# Patient Record
Sex: Male | Born: 1995 | Race: Black or African American | Hispanic: No | Marital: Single | Smoking: Never smoker
Health system: Southern US, Community
[De-identification: ages and names within clinical notes are randomized; demographics above are authoritative.]

---
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
## 2014-11-14 ENCOUNTER — Encounter: Payer: Self-pay | Admitting: Emergency Medicine

## 2014-11-14 ENCOUNTER — Emergency Department
Admission: EM | Admit: 2014-11-14 | Discharge: 2014-11-14 | Disposition: A | Discharge status: Home / Self Care | Payer: BLUE CROSS/BLUE SHIELD | Attending: Emergency Medicine | Admitting: Emergency Medicine

## 2014-11-14 ENCOUNTER — Emergency Department: Payer: BLUE CROSS/BLUE SHIELD

## 2014-11-14 DIAGNOSIS — S86911A Strain of unspecified muscle(s) and tendon(s) at lower leg level, right leg, initial encounter: Secondary | ICD-10-CM | POA: Diagnosis not present

## 2014-11-14 DIAGNOSIS — S8991XA Unspecified injury of right lower leg, initial encounter: Secondary | ICD-10-CM | POA: Diagnosis present

## 2014-11-14 DIAGNOSIS — Y9289 Other specified places as the place of occurrence of the external cause: Secondary | ICD-10-CM | POA: Insufficient documentation

## 2014-11-14 DIAGNOSIS — X58XXXA Exposure to other specified factors, initial encounter: Secondary | ICD-10-CM | POA: Insufficient documentation

## 2014-11-14 DIAGNOSIS — Y9367 Activity, basketball: Secondary | ICD-10-CM | POA: Diagnosis not present

## 2014-11-14 DIAGNOSIS — Y998 Other external cause status: Secondary | ICD-10-CM | POA: Diagnosis not present

## 2014-11-14 MED ORDER — IBUPROFEN 800 MG PO TABS: 800.0000 mg | ORAL_TABLET | Freq: Three times a day (TID) | ORAL | Status: AC | PRN

## 2014-11-14 MED ORDER — CYCLOBENZAPRINE HCL 5 MG PO TABS: 5.0000 mg | ORAL_TABLET | Freq: Three times a day (TID) | ORAL | Status: AC | PRN

## 2014-11-14 NOTE — ED Notes (Signed)
Pain right knee past 6 wks, thinks he may have injured it playing basketball

## 2014-11-14 NOTE — ED Provider Notes (Signed)
[facility] Emergency Department Provider Note  ____________________________________________  Time seen: Approximately 10:07 AM  I have reviewed the triage vital signs and the nursing notes.   HISTORY  Chief Complaint Knee Pain    HPI Trevor Daniel is a 19 y.o. male presents with complaints of right knee pain 6 months. Has been playing a lot of basketball. Increased pain with jumping and landing. Denies severe pain with ambulating, does feel pulling sensation. No noticeable change in strength.   History reviewed. No pertinent past medical history.  Patient Active Problem List   Diagnosis Date Noted  . 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 No pertinent past surgical history.  Current Outpatient Rx  Name  Route  Sig  Dispense  Refill  . cyclobenzaprine (FLEXERIL) 5 MG tablet   Oral   Take 1 tablet (5 mg total) by mouth every 8 (eight) hours as needed for muscle spasms.   30 tablet   0   . ibuprofen (ADVIL,MOTRIN) 800 MG tablet   Oral   Take 1 tablet (800 mg total) by mouth every 8 (eight) hours as needed.   30 tablet   0     Allergies Review of patient's allergies indicates no known allergies.  History reviewed. No pertinent family history.  Social History History  Substance Use Topics  . Smoking status: Never Smoker   . Smokeless tobacco: Not on file  . Alcohol Use: No    Review of Systems Constitutional: No fever/chills Musculoskeletal: Negative for back pain. Skin: Negative for rash. Neurological: Negative for headaches, focal weakness or numbness.  10-point ROS otherwise negative.  ____________________________________________   PHYSICAL EXAM:  VITAL SIGNS: ED Triage Vitals  Enc Vitals Group     BP 11/14/14 0815 168/96 mmHg     Pulse Rate 11/14/14 0815 78     Resp 11/14/14 0815 20     Temp 11/14/14 0815 98 F (36.7 C)     Temp Source 11/14/14 0815 Oral     SpO2  11/14/14 0815 100 %     Weight 11/14/14 0815 240 lb (108.863 kg)     Height 11/14/14 0815 6\' 5"  (1.956 m)     Head Cir --      Peak Flow --      Pain Score 11/14/14 0815 7     Pain Loc --      Pain Edu? --      Excl. in GC? --     Constitutional: Alert and oriented. Well appearing and in no acute distress. Musculoskeletal: No lower extremity tenderness nor edema.  No joint effusions. Neurologic:  Normal speech and language. No gross focal neurologic deficits are appreciated. Speech is normal. No gait instability. Skin:  Skin is warm, dry and intact. No rash noted. Psychiatric: Mood and affect are normal. Speech and behavior are normal.  ____________________________________________   LABS (all labs ordered are listed, but only abnormal results are displayed)  Labs Reviewed - No data to display ____________________________________________  EKG  None ____________________________________________  RADIOLOGY  Interpreted by radiologist reviewed by myself negative for fracture or dislocation. ____________________________________________   PROCEDURES  Procedure(s) performed: None  Critical Care performed: No  ____________________________________________   INITIAL IMPRESSION / ASSESSMENT AND PLAN / ED COURSE  Pertinent labs & imaging results that were available during my care of the patient were reviewed by me and considered in my medical decision making (see chart for details).  Acute right knee strain. Discussed clinical findings  as well as radiological findings with patient. Patient will follow up with orthopedics as needed for continued examination. Rx Motrin 800 and Flexeril 10 mg given patient understands and will return to the ER if worsening symptomatology. ____________________________________________   FINAL CLINICAL IMPRESSION(S) / ED DIAGNOSES  Final diagnoses:  Knee strain, right, initial encounter      Evangeline Dakin, PA-C 11/14/14 1059  Phineas Semen, MD 11/14/14 1230

## 2014-11-14 NOTE — ED Notes (Signed)
Right knee pain x 1 month.  Ambulates without difficulty

## 2014-11-14 NOTE — ED Notes (Signed)
Pt alert and oriented X4, active, cooperative, pt in NAD. RR even and unlabored, color WNL.  Pt informed to return if any life threatening symptoms occur.  Left with family.  

## 2014-11-14 NOTE — Discharge Instructions (Signed)
Joint Sprain A sprain is a tear or stretch in the ligaments that hold a joint together. Severe sprains may need as long as 3-6 weeks of immobilization and/or exercises to heal completely. Sprained joints should be rested and protected. If not, they can become unstable and prone to re-injury. Proper treatment can reduce your pain, shorten the period of disability, and reduce the risk of repeated injuries. TREATMENT   Rest and elevate the injured joint to reduce pain and swelling.  Apply ice packs to the injury for 20-30 minutes every 2-3 hours for the next 2-3 days.  Keep the injury wrapped in a compression bandage or splint as long as the joint is painful or as instructed by your caregiver.  Do not use the injured joint until it is completely healed to prevent re-injury and chronic instability. Follow the instructions of your caregiver.  Long-term sprain management may require exercises and/or treatment by a physical therapist. Taping or special braces may help stabilize the joint until it is completely better. SEEK MEDICAL CARE IF:   You develop increased pain or swelling of the joint.  You develop increasing redness and warmth of the joint.  You develop a fever.  It becomes stiff.  Your hand or foot gets cold or numb. Document Released: 07/17/2004 Document Revised: 09/01/2011 Document Reviewed: 06/26/2008 [facility] Patient Information 2015 [place], [place]. This information is not intended to replace advice given to you by your health care provider. Make sure you discuss any questions you have with your health care provider.  Knee Sprain A knee sprain is a tear in one of the strong, fibrous tissues that connect the bones (ligaments) in your knee. The severity of the sprain depends on how much of the ligament is torn. The tear can be either partial or complete. CAUSES  Often, sprains are a result of a fall or injury. The force of the impact causes the fibers of your ligament to stretch  too much. This excess tension causes the fibers of your ligament to tear. SIGNS AND SYMPTOMS  You may have some loss of motion in your knee. Other symptoms include:  Bruising.  Pain in the knee area.  Tenderness of the knee to the touch.  Swelling. DIAGNOSIS  To diagnose a knee sprain, your health care provider will physically examine your knee. Your health care provider may also suggest an X-ray exam of your knee to make sure no bones are broken. TREATMENT  If your ligament is only partially torn, treatment usually involves keeping the knee in a fixed position (immobilization) or bracing your knee for activities that require movement for several weeks. To do this, your health care provider will apply a bandage, cast, or splint to keep your knee from moving and to support your knee during movement until it heals. For a partially torn ligament, the healing process usually takes 4-6 weeks. If your ligament is completely torn, depending on which ligament it is, you may need surgery to reconnect the ligament to the bone or reconstruct it. After surgery, a cast or splint may be applied and will need to stay on your knee for 4-6 weeks while your ligament heals. HOME CARE INSTRUCTIONS  Keep your injured knee elevated to decrease swelling.  To ease pain and swelling, apply ice to the injured area:  Put ice in a plastic bag.  Place a towel between your skin and the bag.  Leave the ice on for 20 minutes, 2-3 times a day.  Only take medicine for  pain as directed by your health care provider.  Do not leave your knee unprotected until pain and stiffness go away (usually 4-6 weeks).  If you have a cast or splint, do not allow it to get wet. If you have been instructed not to remove it, cover it with a plastic bag when you shower or bathe. Do not swim.  Your health care provider may suggest exercises for you to do during your recovery to prevent or limit permanent weakness and stiffness. SEEK  IMMEDIATE MEDICAL CARE IF:  Your cast or splint becomes damaged.  Your pain becomes worse.  You have significant pain, swelling, or numbness below the cast or splint. MAKE SURE YOU:  Understand these instructions.  Will watch your condition.  Will get help right away if you are not doing well or get worse. Document Released: 06/09/2005 Document Revised: 03/30/2013 Document Reviewed: 01/19/2013 [facility] Patient Information 2015 [place], [place]. This information is not intended to replace advice given to you by your health care provider. Make sure you discuss any questions you have with your health care provider.

## 2015-12-22 IMAGING — CR DG KNEE COMPLETE 4+V*L*
1 series · 4 of 4 positions shown · non-contrast
Comparison: None.

CLINICAL DATA: Knee pain following injury 2 days ago.

EXAM:
LEFT KNEE - COMPLETE 4+ VIEW

[Series 1: oblique · 0.17mm/px · 4 of 4 slices shown]
[im 1/4]
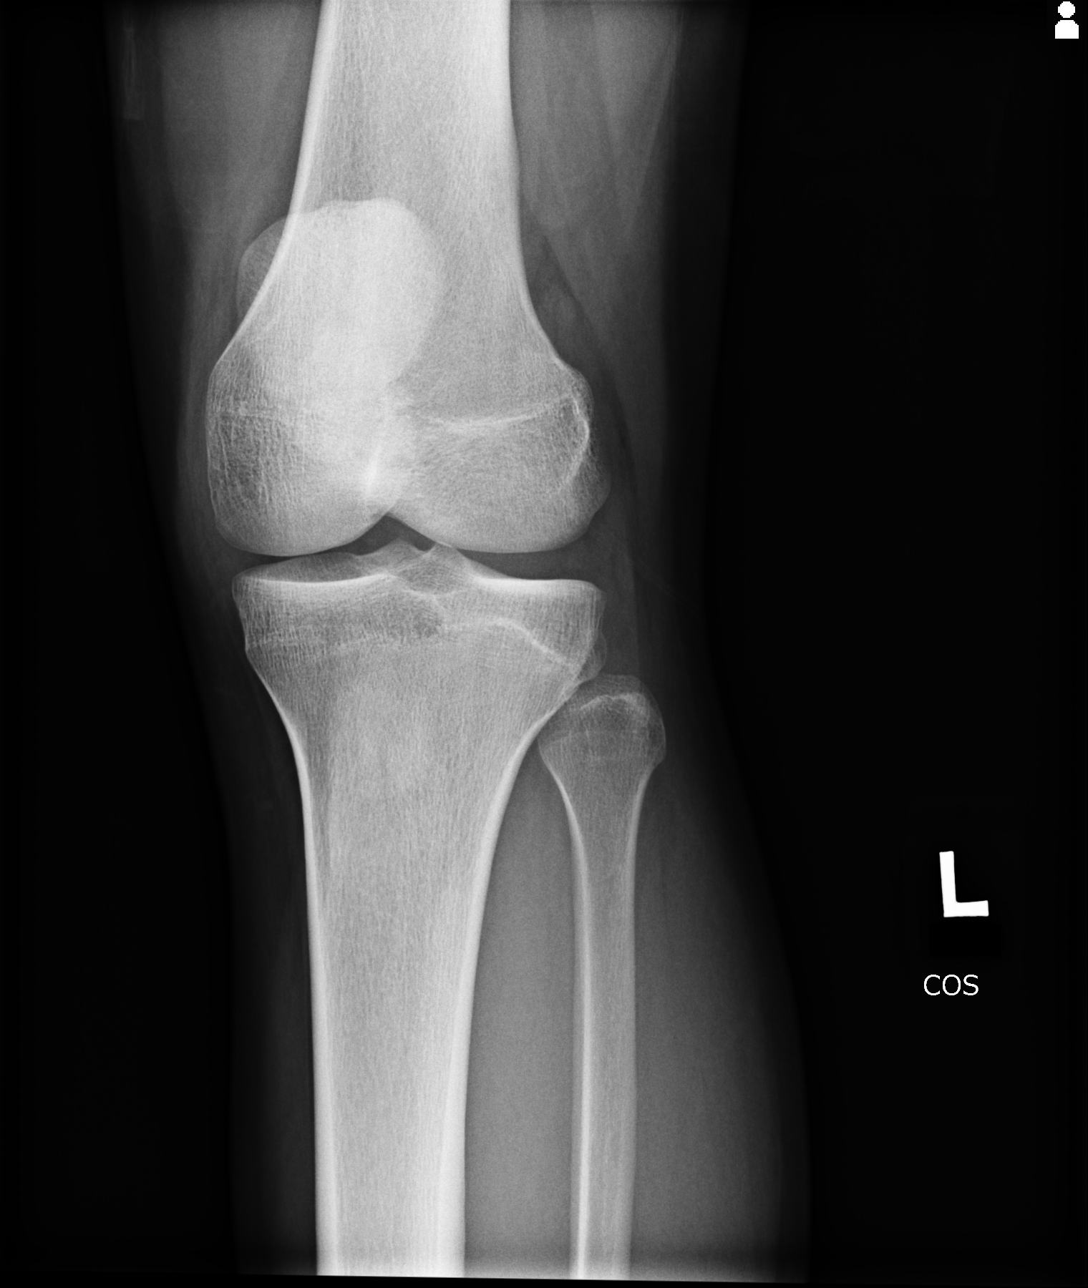
[im 2/4]
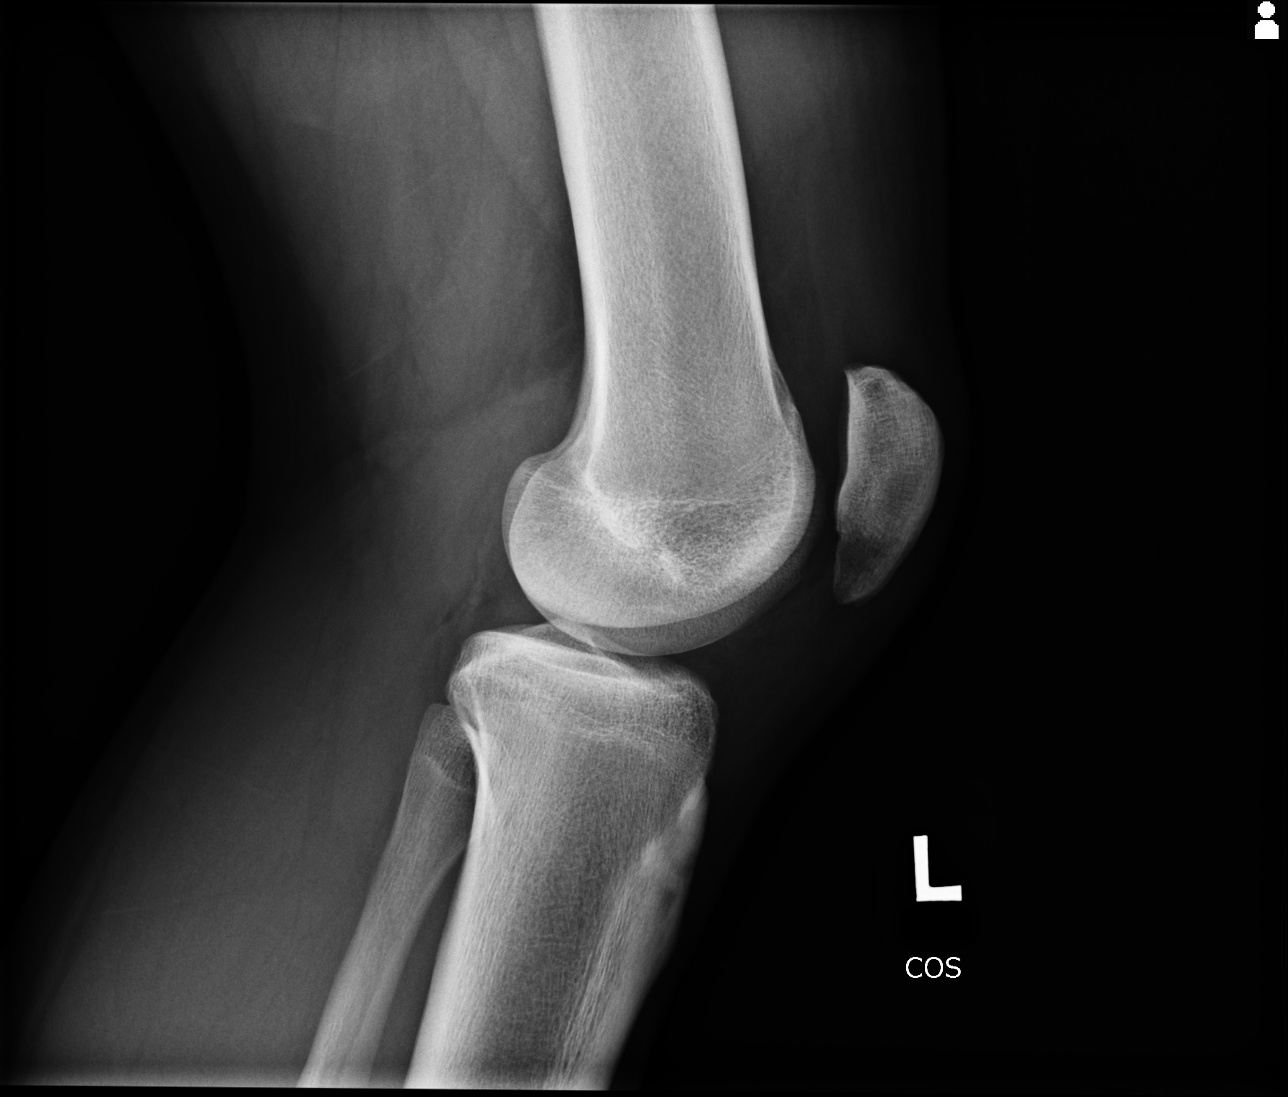
[im 3/4]
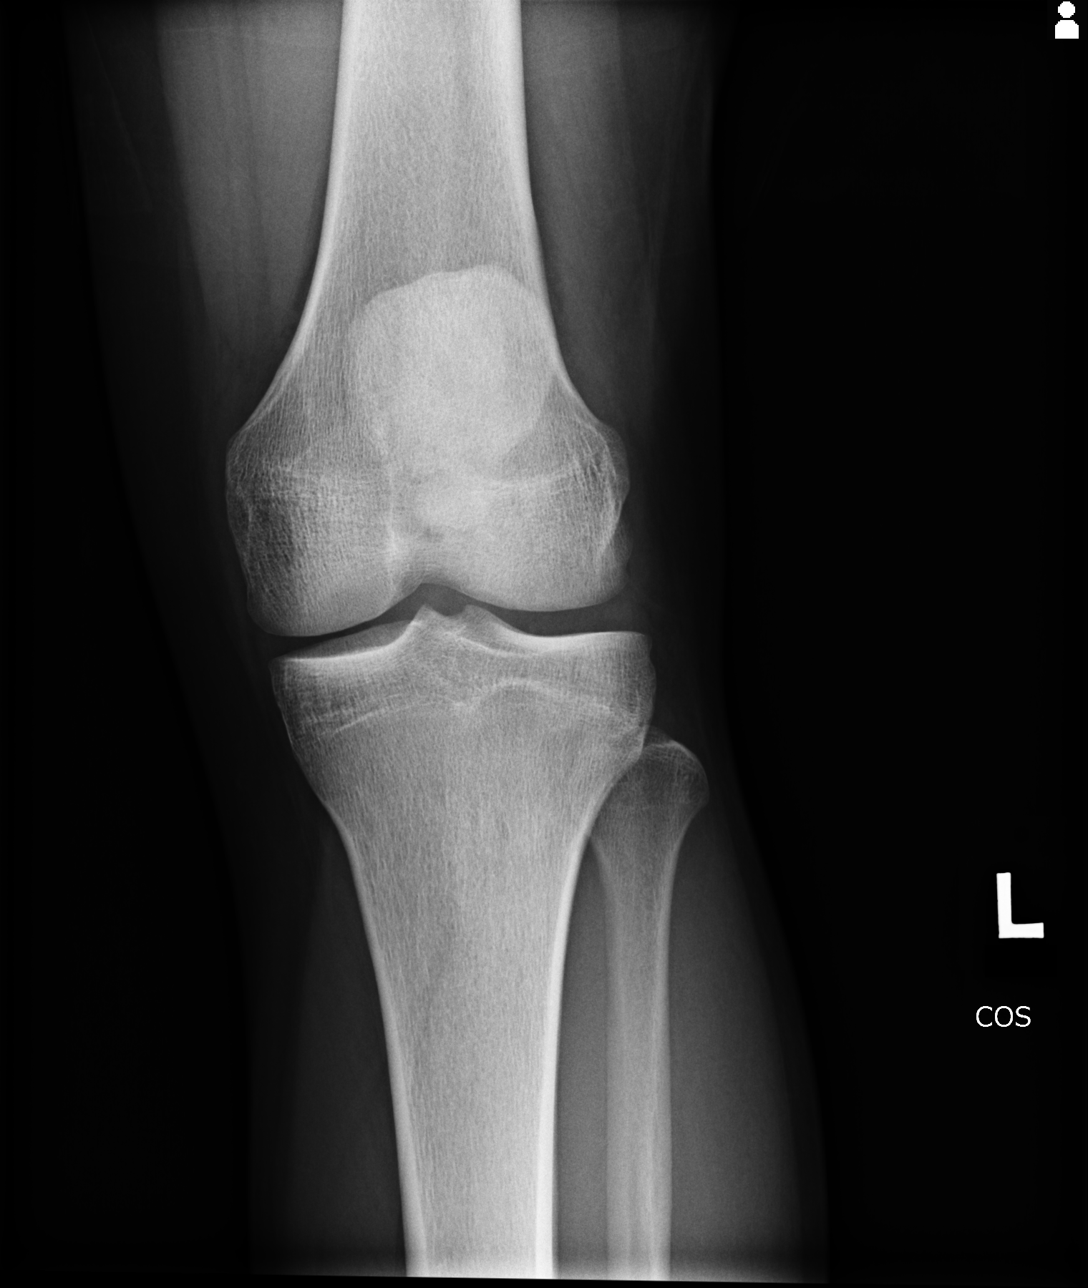
[im 4/4]
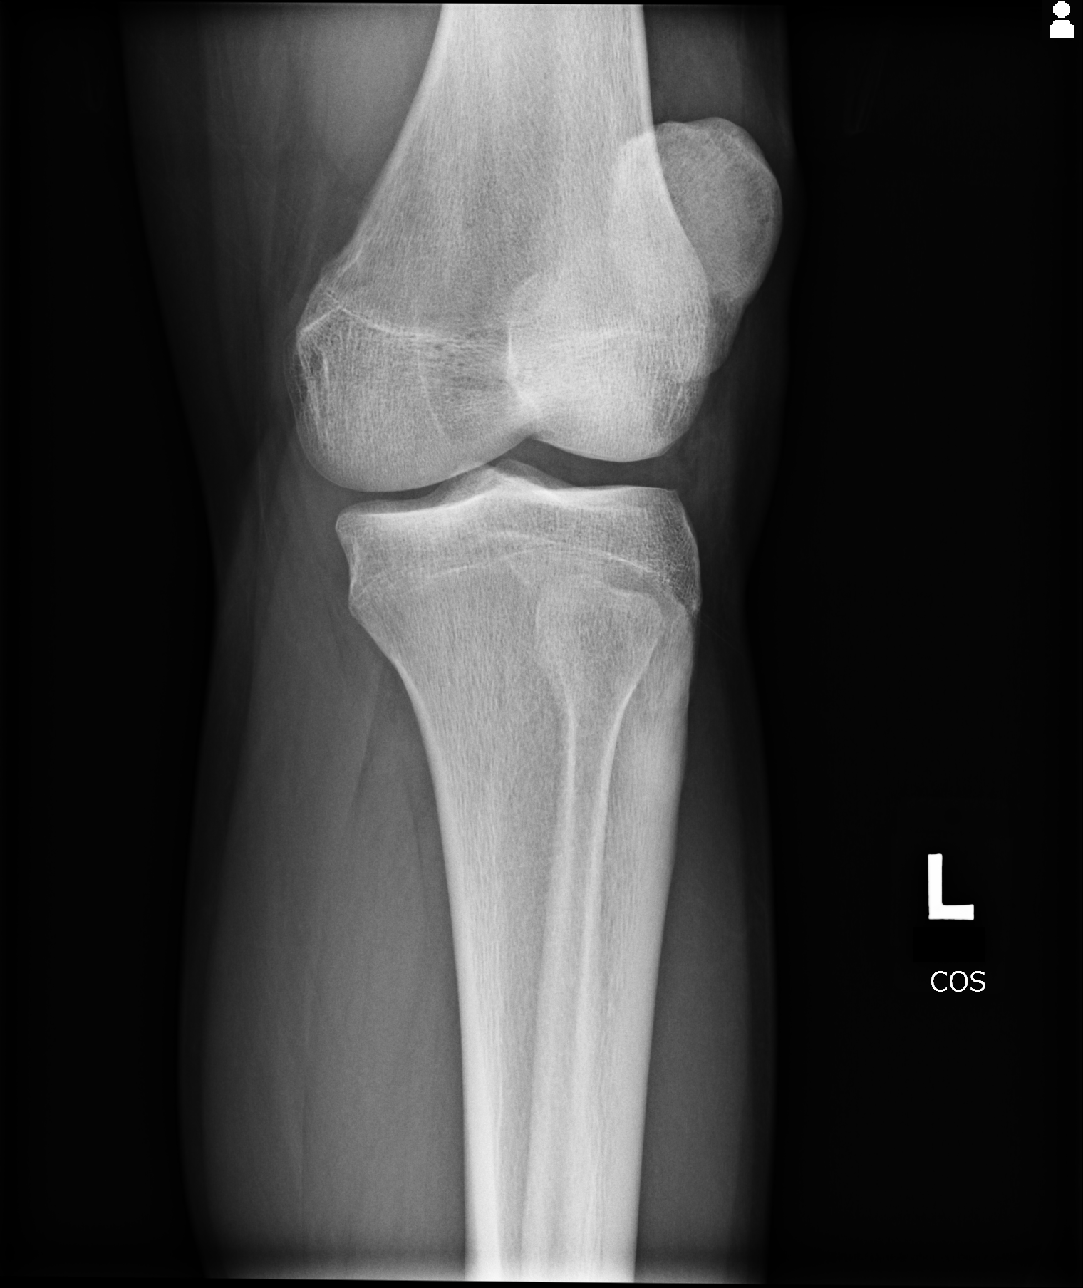

[4 of 4 positions shown; findings below may reference images not displayed]

FINDINGS: The mineralization and alignment are normal. There is no evidence of
acute fracture or dislocation. The joint spaces are maintained. No
joint effusion or focal soft tissue swelling identified.
IMPRESSION: No acute osseous findings.

## 2016-02-02 ENCOUNTER — Emergency Department
Admission: EM | Admit: 2016-02-02 | Discharge: 2016-02-02 | Disposition: A | Discharge status: Home / Self Care | Payer: Self-pay | Attending: Emergency Medicine | Admitting: Emergency Medicine

## 2016-02-02 ENCOUNTER — Emergency Department: Payer: Self-pay

## 2016-02-02 ENCOUNTER — Encounter: Payer: Self-pay | Admitting: Emergency Medicine

## 2016-02-02 DIAGNOSIS — R079 Chest pain, unspecified: Secondary | ICD-10-CM | POA: Insufficient documentation

## 2016-02-02 LAB — BASIC METABOLIC PANEL
ANION GAP: 10 (ref 5–15)
BUN: 15 mg/dL (ref 6–20)
CALCIUM: 9.9 mg/dL (ref 8.9–10.3)
CO2: 25 mmol/L (ref 22–32)
Chloride: 106 mmol/L (ref 101–111)
Creatinine, Ser: 1.32 mg/dL — ABNORMAL HIGH (ref 0.61–1.24)
GFR calc Af Amer: >60 mL/min (ref >60)
GFR calc non Af Amer: >60 mL/min (ref >60)
Glucose, Bld: 92 mg/dL (ref 65–99)
Potassium: 3.7 mmol/L (ref 3.5–5.1)
Sodium: 141 mmol/L (ref 135–145)

## 2016-02-02 LAB — CBC
HCT: 47.4 % (ref 40.0–52.0)
HEMOGLOBIN: 16.7 g/dL (ref 13.0–18.0)
MCH: 29.2 pg (ref 26.0–34.0)
MCHC: 35.2 g/dL (ref 32.0–36.0)
MCV: 83 fL (ref 80.0–100.0)
Platelets: 249 10*3/uL (ref 150–440)
RBC: 5.71 MIL/uL (ref 4.40–5.90)
RDW: 13.5 % (ref 11.5–14.5)
WBC: 10 10*3/uL (ref 3.8–10.6)

## 2016-02-02 LAB — TROPONIN I

## 2016-02-02 MED ORDER — TRAMADOL HCL 50 MG PO TABS: 50.0000 mg | ORAL_TABLET | Freq: Four times a day (QID) | ORAL | 0 refills | Status: AC | PRN

## 2016-02-02 NOTE — ED Triage Notes (Addendum)
Pt reports Chest pain past couple of days, SOB states He feels like that side of his chest looks larger. Denies cough. C/o palpitations and high blood pressure

## 2016-02-02 NOTE — Discharge Instructions (Signed)
You have been seen in the emergency department today for chest pain. Your workup has shown normal results. As we discussed please follow-up with your primary care physician in the next 1-2 days for recheck. Return to the emergency department for any further chest pain, trouble breathing, or any other symptom personally concerning to yourself. °

## 2016-02-02 NOTE — ED Notes (Signed)
Patient c/o left sided chest pain that started 2 weeks ago, pain comes and goes, does not occur every day. Patient also reports some shortness of breath that comes and goes. Patient states that he was online looking at his symptoms and saw that it could possibly indicated an enlarged heart and he was concerned and would like to be evaluated.

## 2016-02-02 NOTE — ED Provider Notes (Signed)
[facility] Provider [facility]  Time seen: 1:16 PM  I have reviewed the triage vital signs and the nursing notes.   HISTORY  Chief Complaint Chest Pain    HPI Trevor Daniel is a 20 y.o. male with no past medical history who presents the emergency department for intermittent chest pain over the past 2 weeks. According to the patient for the past 2 weeks he has been intermittently having left-sided chest pain which she describes as mild and dull. He thinks it could be related to high blood pressure. Patient has never been formally diagnosed with high blood pressure. Patient denies any chest pain with exertion. States he plays basketball without any chest pain. Denies any cough, congestion, lower extremity swelling or pain. Denies any shortness of breath.  History reviewed. No pertinent past medical history.  Patient Active Problem List   Diagnosis Date Noted  . 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 No pertinent surgical history.  Prior to Admission medications   Medication Sig Start Date End Date Taking? Authorizing Provider  cyclobenzaprine (FLEXERIL) 5 MG tablet Take 1 tablet (5 mg total) by mouth every 8 (eight) hours as needed for muscle spasms. Patient not taking: Reported on 02/02/2016 11/14/14   Charmayne Sheerharles M Beers, PA-C  ibuprofen (ADVIL,MOTRIN) 800 MG tablet Take 1 tablet (800 mg total) by mouth every 8 (eight) hours as needed. Patient not taking: Reported on 02/02/2016 11/14/14   Evangeline Dakinharles M Beers, PA-C    No Known Allergies  No family history on file.  Social History Social History  Substance Use Topics  . Smoking status: Never Smoker  . Smokeless tobacco: Never Used  . Alcohol use No    Review of Systems Constitutional: Negative for fever. Cardiovascular: Intermittent left chest pain. Respiratory: Negative for shortness of breath. Gastrointestinal: Negative for abdominal  pain Musculoskeletal: Negative for back pain. Neurological: Negative for headaches, focal weakness or numbness. 10-point ROS otherwise negative.  ____________________________________________   PHYSICAL EXAM:  VITAL SIGNS: ED Triage Vitals  Enc Vitals Group     BP 02/02/16 1131 (!) 172/112     Pulse Rate 02/02/16 1131 (!) 109     Resp 02/02/16 1131 20     Temp 02/02/16 1131 98.5 F (36.9 C)     Temp Source 02/02/16 1131 Oral     SpO2 02/02/16 1131 97 %     Weight 02/02/16 1132 250 lb (113.4 kg)     Height 02/02/16 1132 6\' 5"  (1.956 m)     Head Circumference --      Peak Flow --      Pain Score 02/02/16 1132 5     Pain Loc --      Pain Edu? --      Excl. in GC? --     Constitutional: Alert and oriented. Well appearing and in no distress. Eyes: Normal exam ENT   Head: Normocephalic and atraumatic.   Mouth/Throat: Mucous membranes are moist. Cardiovascular: Normal rate, regular rhythm. No murmur Respiratory: Normal respiratory effort without tachypnea nor retractions. Breath sounds are clear. No chest tenderness to palpation. Gastrointestinal: Soft and nontender. No distention.   Musculoskeletal: Nontender with normal range of motion in all extremities. No lower extremity tenderness or edema. Neurologic:  Normal speech and language. No gross focal neurologic deficits are appreciated. Skin:  Skin is warm, dry and intact.  Psychiatric: Mood and affect are normal. Speech and behavior are normal.   ____________________________________________    EKG  EKG reviewed and interpreted, so shows sinus tachycardia 101 bpm, narrow QRS, normal axis, normal intervals, no concerning ST changes. Mild tachycardia, otherwise normal EKG.  ____________________________________________    RADIOLOGY  Chest x-ray negative  ____________________________________________   INITIAL IMPRESSION / ASSESSMENT AND PLAN / ED COURSE  Pertinent labs & imaging results that were available  during my care of the patient were reviewed by me and considered in my medical decision making (see chart for details).  The patient presents the emergency department intermittent chest pain over the past 2 weeks. Denies any exertional component. Patient denies any chest pain currently. Labs are within normal limits including negative troponin. EKG shows mild tachycardia at 101 bpm otherwise normal. Overall patient appears very well with a normal physical exam. Denies any chest pain at this time. We will discharge home with a short course of Ultram to be taken as needed for chest discomfort. I discussed with the patient if the pain does not resolve within one week following up with cardiology. Patient is agreeable.   ____________________________________________   FINAL CLINICAL IMPRESSION(S) / ED DIAGNOSES  Chest pain    Minna Antis, MD 02/02/16 1319

## 2016-02-04 ENCOUNTER — Telehealth: Payer: Self-pay

## 2016-02-04 NOTE — Telephone Encounter (Signed)
Called multiple times, no answer and vm was full. Needed to make ED fu appointment.  Will try again later

## 2016-02-05 NOTE — Telephone Encounter (Signed)
Called patient, no answer and vm was full. Needed to make ED fu appointment.  Will try again later

## 2016-08-11 ENCOUNTER — Encounter: Payer: Self-pay | Admitting: Emergency Medicine

## 2016-08-11 ENCOUNTER — Emergency Department: Payer: Self-pay

## 2016-08-11 ENCOUNTER — Emergency Department
Admission: EM | Admit: 2016-08-11 | Discharge: 2016-08-11 | Disposition: A | Discharge status: Home / Self Care | Payer: Self-pay | Attending: Emergency Medicine | Admitting: Emergency Medicine

## 2016-08-11 DIAGNOSIS — R0789 Other chest pain: Secondary | ICD-10-CM

## 2016-08-11 LAB — COMPREHENSIVE METABOLIC PANEL
ALT: 19 U/L (ref 17–63)
AST: 20 U/L (ref 15–41)
Albumin: 4.5 g/dL (ref 3.5–5.0)
Alkaline Phosphatase: 85 U/L (ref 38–126)
Anion gap: 6 (ref 5–15)
BUN: 9 mg/dL (ref 6–20)
CO2: 31 mmol/L (ref 22–32)
Calcium: 9.4 mg/dL (ref 8.9–10.3)
Chloride: 102 mmol/L (ref 101–111)
Creatinine, Ser: 1.2 mg/dL (ref 0.61–1.24)
GFR calc Af Amer: >60 mL/min (ref >60)
GFR calc non Af Amer: >60 mL/min (ref >60)
Glucose, Bld: 97 mg/dL (ref 65–99)
Potassium: 3.6 mmol/L (ref 3.5–5.1)
Sodium: 139 mmol/L (ref 135–145)
Total Bilirubin: 0.5 mg/dL (ref 0.3–1.2)
Total Protein: 7.7 g/dL (ref 6.5–8.1)

## 2016-08-11 LAB — CBC
HCT: 46.2 % (ref 40.0–52.0)
Hemoglobin: 15.6 g/dL (ref 13.0–18.0)
MCH: 28 pg (ref 26.0–34.0)
MCHC: 33.8 g/dL (ref 32.0–36.0)
MCV: 83 fL (ref 80.0–100.0)
Platelets: 215 10*3/uL (ref 150–440)
RBC: 5.57 MIL/uL (ref 4.40–5.90)
RDW: 13.2 % (ref 11.5–14.5)
WBC: 5.8 10*3/uL (ref 3.8–10.6)

## 2016-08-11 LAB — TROPONIN I: Troponin I: <0.03 ng/mL (ref <0.03)

## 2016-08-11 NOTE — ED Provider Notes (Signed)
[facility] Emergency Department Provider Note   ____________________________________________    I have reviewed the triage vital signs and the nursing notes.   HISTORY  Chief Complaint Chest Pain     HPI Trevor Daniel is a 21 y.o. male who presents with complaints of intermittent chest pain. Patient reports this has been occurring over the last 4-5 months and has actually improved over the last month. He reports it used to occur as frequently as 5 or 6 times a week and now it only occurs once or twice a week. He reports there is no pattern to the discomfort but reports he feels a brief sharp pain in the left side of his chest. It is nonexertional and seemingly random. Not related to eating. No shortness of breath. No recent travel. No calf pain or swelling. No fevers or chills or cough. Does not smoke or drink   History reviewed. No pertinent past medical history.  Patient Active Problem List   Diagnosis Date Noted  . 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 No pertinent surgical history.  Prior to Admission medications   Medication Sig Start Date End Date Taking? Authorizing Provider  cyclobenzaprine (FLEXERIL) 5 MG tablet Take 1 tablet (5 mg total) by mouth every 8 (eight) hours as needed for muscle spasms. Patient not taking: Reported on 02/02/2016 11/14/14   Charmayne Sheer Beers, PA-C  ibuprofen (ADVIL,MOTRIN) 800 MG tablet Take 1 tablet (800 mg total) by mouth every 8 (eight) hours as needed. Patient not taking: Reported on 02/02/2016 11/14/14   Charmayne Sheer Beers, PA-C  traMADol (ULTRAM) 50 MG tablet Take 1 tablet (50 mg total) by mouth every 6 (six) hours as needed. Patient not taking: Reported on 08/11/2016 02/02/16   Minna Antis, MD     Allergies Patient has no known allergies.  History reviewed. No pertinent family history.  Social History Social History  Substance Use Topics  . Smoking status:  Never Smoker  . Smokeless tobacco: Never Used  . Alcohol use No    Review of Systems  Constitutional: No fever/chills Eyes: No visual changes.   Cardiovascular: As above Respiratory: Denies shortness of breath. Gastrointestinal: No abdominal pain.  No nausea, no vomiting.    Musculoskeletal: Negative for back pain. Skin: Negative for rash. Neurological: Negative for headaches  10-point ROS otherwise negative.  ____________________________________________   PHYSICAL EXAM:  VITAL SIGNS: ED Triage Vitals  Enc Vitals Group     BP 08/11/16 0757 (!) 149/98     Pulse Rate 08/11/16 0757 83     Resp 08/11/16 0757 18     Temp 08/11/16 0757 98.3 F (36.8 C)     Temp Source 08/11/16 0757 Oral     SpO2 08/11/16 0757 99 %     Weight 08/11/16 0749 250 lb (113.4 kg)     Height 08/11/16 0749 6\' 5"  (1.956 m)     Head Circumference --      Peak Flow --      Pain Score 08/11/16 0749 5     Pain Loc --      Pain Edu? --      Excl. in GC? --     Constitutional: Alert and oriented. No acute distress. Pleasant and interactive Eyes: Conjunctivae are normal.  Head: Atraumatic. Nose: No congestion/rhinnorhea. Mouth/Throat: Mucous membranes are moist.   Cardiovascular: Normal rate, regular rhythm. Grossly normal heart sounds.  Good peripheral circulation. No chest wall tenderness to palpation Respiratory:  Normal respiratory effort.  No retractions. Lungs CTAB. Gastrointestinal: Soft and nontender. No distention.  No CVA tenderness. Genitourinary: deferred Musculoskeletal: No lower extremity tenderness nor edema.  Warm and well perfused Neurologic:  Normal speech and language. No gross focal neurologic deficits are appreciated.  Skin:  Skin is warm, dry and intact. No rash noted. Psychiatric: Mood and affect are normal. Speech and behavior are normal.  ____________________________________________   LABS (all labs ordered are listed, but only abnormal results are displayed)  Labs  Reviewed  CBC  COMPREHENSIVE METABOLIC PANEL  TROPONIN I   ____________________________________________  EKG  ED ECG REPORT I, Jene EveryKINNER, Kainoah Bartosiewicz, the attending physician, personally viewed and interpreted this ECG.  Date: 08/11/2016 EKG Time: 7:51 AM Rate: 84 Rhythm: normal sinus rhythm QRS Axis: normal Intervals: normal ST/T Wave abnormalities: normal Conduction Disturbances: none Narrative Interpretation: unremarkable  ____________________________________________  RADIOLOGY  Chest x-ray unremarkable ____________________________________________   PROCEDURES  Procedure(s) performed: No    Critical Care performed: No ____________________________________________   INITIAL IMPRESSION / ASSESSMENT AND PLAN / ED COURSE  Pertinent labs & imaging results that were available during my care of the patient were reviewed by me and considered in my medical decision making (see chart for details).  Patient well-appearing and in no stress. He is asymptomatic currently. He is here because he wanted to get symptoms "checked out" although he is reassured that it seems to be improving. Symptoms do not appear consistent with ACS, pericarditis, myocarditis, pneumonia, nor pneumothorax. Lab work is reassuring, chest x-ray is normal. EKG is normal. Discussed with patient the need for outpatient follow-up with cardiology given the chronicity of his symptoms    ____________________________________________   FINAL CLINICAL IMPRESSION(S) / ED DIAGNOSES  Final diagnoses:  None      NEW MEDICATIONS STARTED DURING THIS VISIT:  New Prescriptions   No medications on file     Note:  This document was prepared using Dragon voice recognition software and may include unintentional dictation errors.    Jene Everyobert Yoselyn Mcglade, MD 08/11/16 [phone]

## 2016-08-11 NOTE — ED Notes (Signed)
Pt not in room when this writer went in to give d/c instructions. Pt did not let anyone know that he was leaving.

## 2016-08-11 NOTE — ED Triage Notes (Signed)
Pt to ed with c/o chest pain intermittent x 3-4 months.  States less currently at only 2 times per week usually.  Pt states he feels sob at times with the chest pain.

## 2018-02-02 IMAGING — CR DG CHEST 2V
1 series · 2 of 2 positions shown · non-contrast
Comparison: None.

CLINICAL DATA: Chest pain

EXAM:
CHEST  2 VIEW

[Series 1: dg chest 2 view · 0.14mm/px · 2 of 2 slices shown]
[im 1/2]
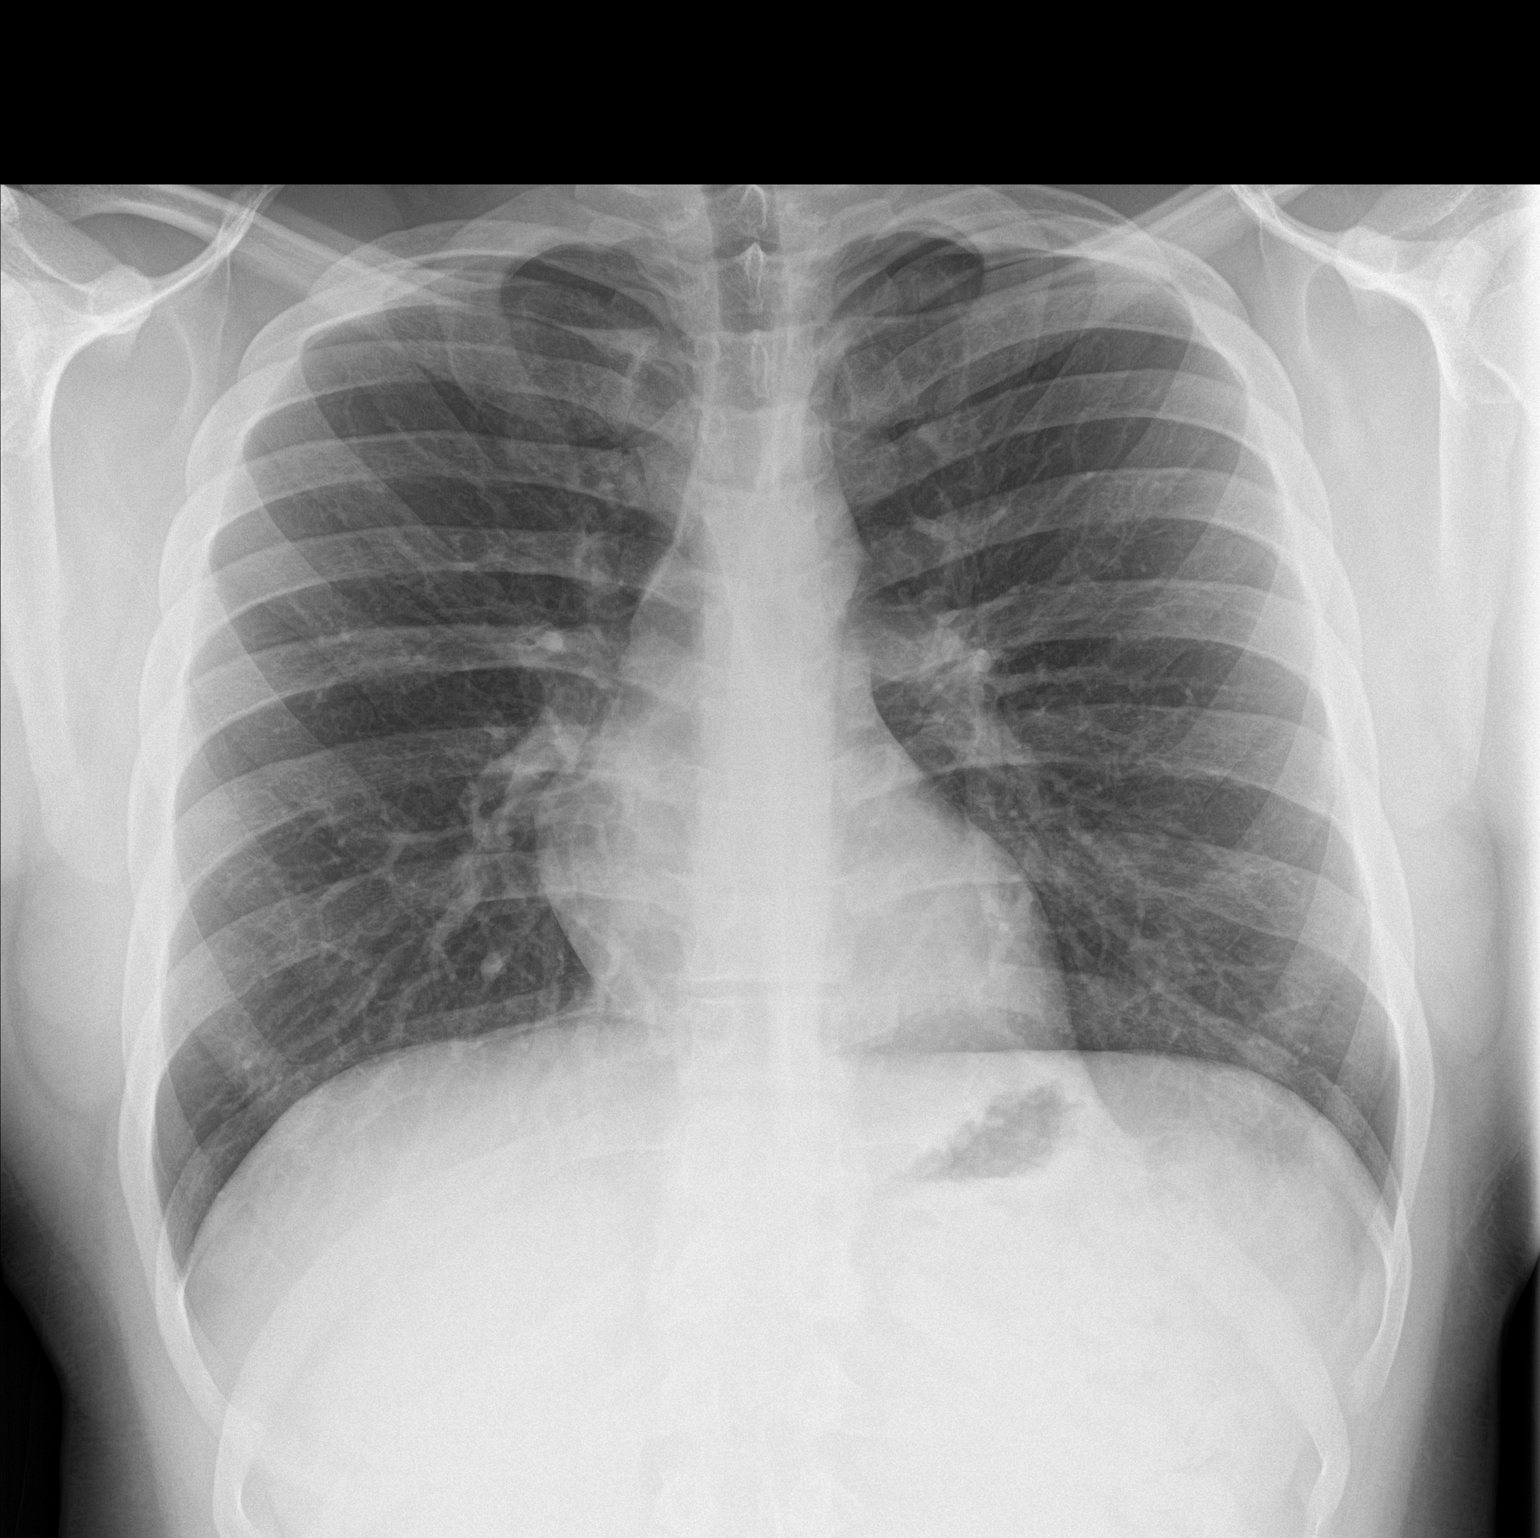
[im 2/2]
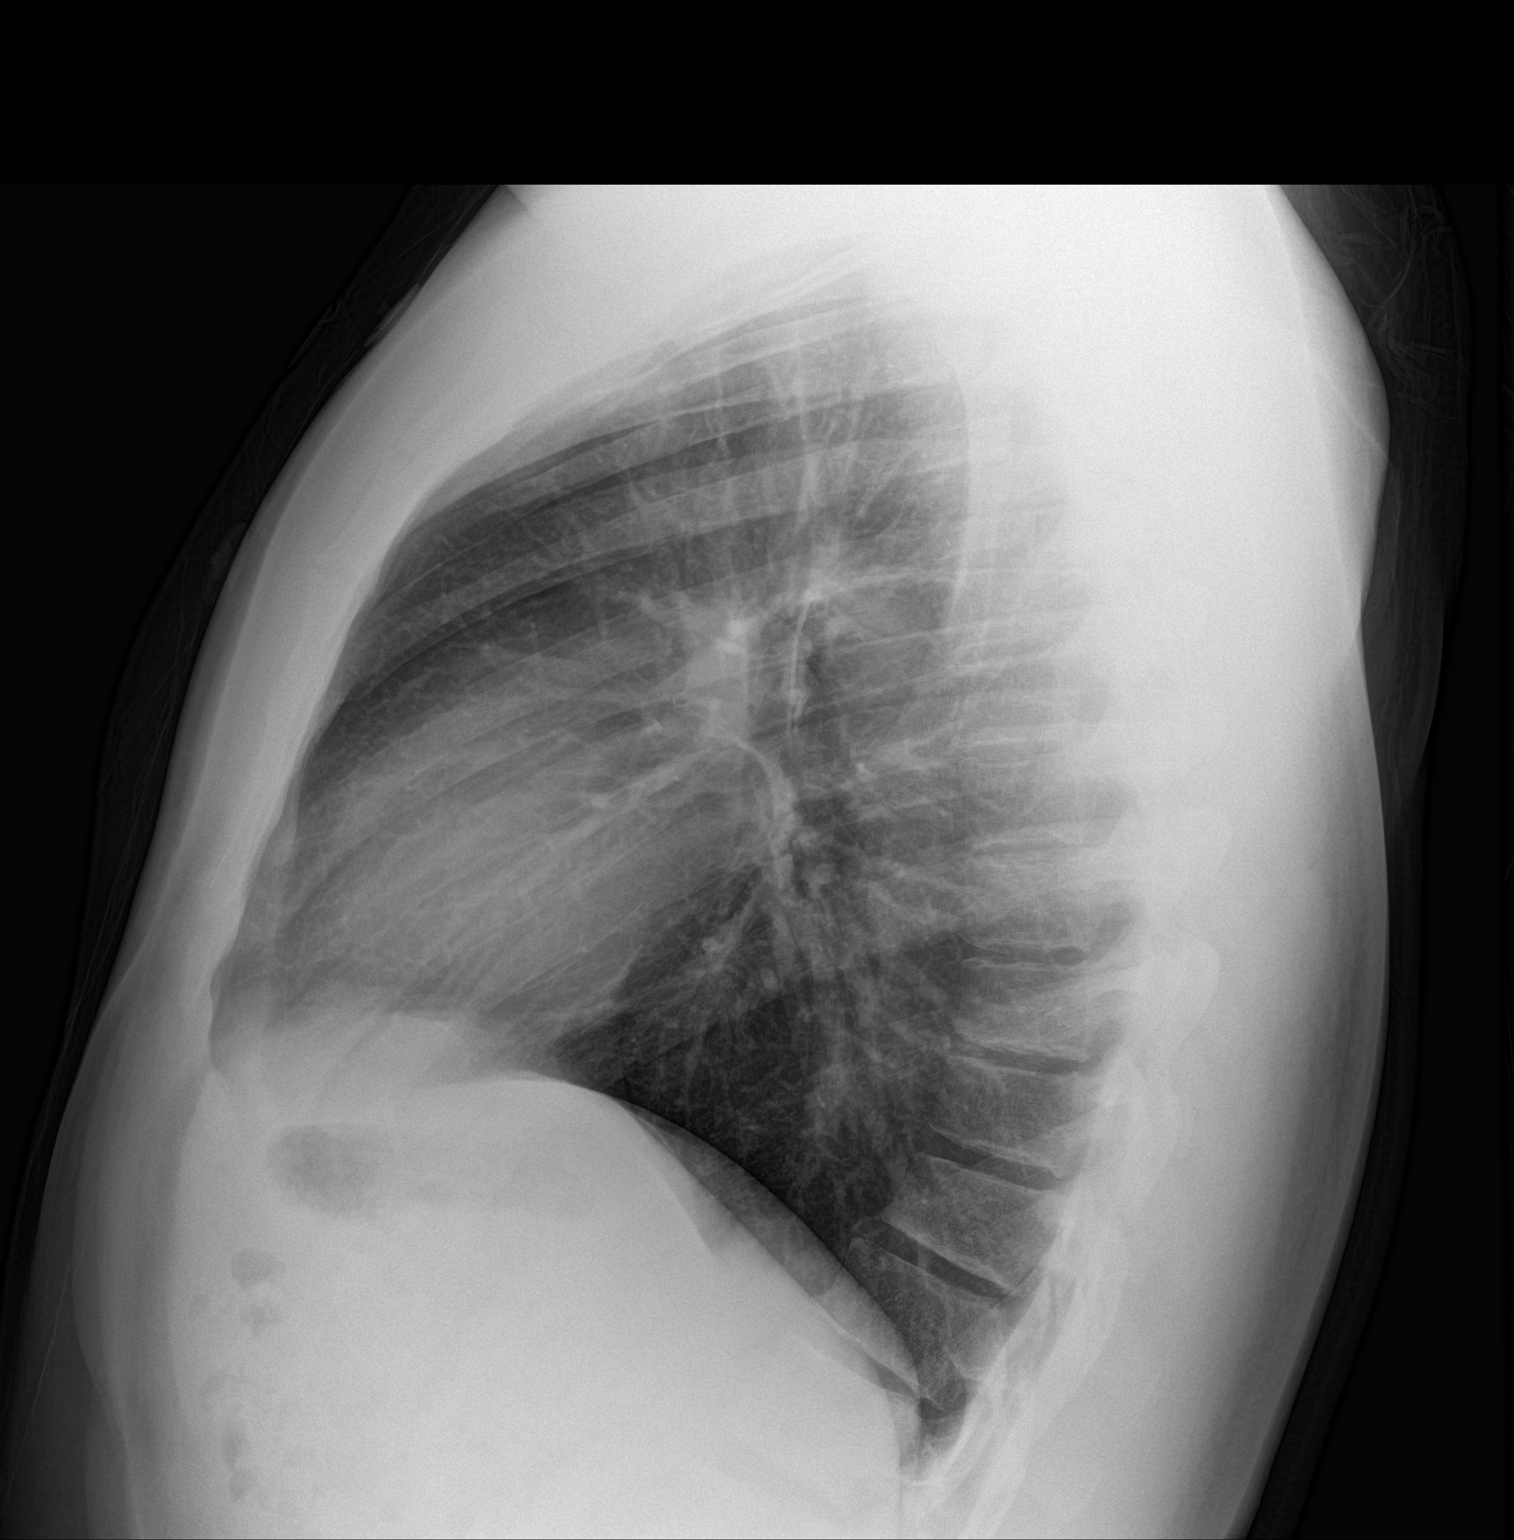

[2 of 2 positions shown; findings below may reference images not displayed]

FINDINGS: Normal heart size. Lungs clear. No pneumothorax. No pleural
effusion.
IMPRESSION: No active cardiopulmonary disease.

## 2021-08-12 ENCOUNTER — Ambulatory Visit: Payer: Self-pay | Admitting: *Deleted

## 2021-08-12 NOTE — Telephone Encounter (Signed)
°  Chief Complaint: Right groin pain Symptoms: "Turned quickly, strained." Frequency: Saturday Pertinent Negatives: Patient denies redness, warmth, does not radiate, states "Mild St.1-2" Disposition: [] ED /[] Urgent Care (no appt availability in office) / [] Appointment(In office/virtual)/ []  [facility]/ [x] Home Care/ [] Refused Recommended Disposition /[] [facility]/ []  Follow-up with PCP Additional Notes: Advised [facility] for worsening symptoms.No PCP  Reason for Disposition  Minor injury or pain (e.g., mild limp) from twisting or over-stretching  Answer Assessment - Initial Assessment Questions 1. MECHANISM: "How did the injury happen?" (e.g., twisting injury, direct blow)      Turned quickly 2. ONSET: "When did the injury happen?" (Minutes or hours ago)      SAturday 3. LOCATION: "Where is the injury located?"      left 4. APPEARANCE of INJURY: "What does the injury look like?"  (e.g., looks normal; bruise, swelling)     no 5. PAIN: "Is there pain?" If Yes, ask: "How bad is the pain?"   "What does it keep you from doing?" (e.g., Scale 1-10; or mild, moderate, severe)   -  NONE: (0): no pain   -  MILD (1-3): doesn't interfere with normal activities    -  MODERATE (4-7): interferes with normal activities (e.g., work or school) or awakens from sleep, limping    -  SEVERE (8-10): excruciating pain, unable to do any normal activities, unable to walk     6/10 with movement  Protocols used: Groin Injury and Strain-A-AH
